# Patient Record
Sex: Female | Born: 1990 | Race: Asian | Hispanic: No | Marital: Single | Smoking: Never smoker
Health system: Southern US, Community
[De-identification: ages and names within clinical notes are randomized; demographics above are authoritative.]

## PROBLEM LIST (undated history)

## (undated) ENCOUNTER — Inpatient Hospital Stay (HOSPITAL_COMMUNITY): Payer: Self-pay

## (undated) DIAGNOSIS — D649 Anemia, unspecified: Secondary | ICD-10-CM

## (undated) DIAGNOSIS — Z789 Other specified health status: Secondary | ICD-10-CM

## (undated) HISTORY — PX: NO PAST SURGERIES: SHX2092

---

## 2012-07-11 ENCOUNTER — Inpatient Hospital Stay (HOSPITAL_COMMUNITY)
Admission: AD | Admit: 2012-07-11 | Discharge: 2012-07-11 | Disposition: A | Discharge status: Home / Self Care | Payer: BC Managed Care – PPO | Source: Ambulatory Visit | Attending: Obstetrics & Gynecology | Admitting: Obstetrics & Gynecology

## 2012-07-11 ENCOUNTER — Inpatient Hospital Stay (HOSPITAL_COMMUNITY): Payer: BC Managed Care – PPO

## 2012-07-11 ENCOUNTER — Encounter (HOSPITAL_COMMUNITY): Payer: Self-pay | Admitting: *Deleted

## 2012-07-11 DIAGNOSIS — M545 Low back pain, unspecified: Secondary | ICD-10-CM | POA: Insufficient documentation

## 2012-07-11 DIAGNOSIS — Z3490 Encounter for supervision of normal pregnancy, unspecified, unspecified trimester: Secondary | ICD-10-CM

## 2012-07-11 DIAGNOSIS — R109 Unspecified abdominal pain: Secondary | ICD-10-CM | POA: Insufficient documentation

## 2012-07-11 DIAGNOSIS — O26899 Other specified pregnancy related conditions, unspecified trimester: Secondary | ICD-10-CM

## 2012-07-11 DIAGNOSIS — O99891 Other specified diseases and conditions complicating pregnancy: Secondary | ICD-10-CM | POA: Insufficient documentation

## 2012-07-11 HISTORY — DX: Other specified health status: Z78.9

## 2012-07-11 LAB — CBC WITH DIFFERENTIAL/PLATELET
Basophils Absolute: 0 10*3/uL (ref 0.0–0.1)
Eosinophils Absolute: 0.1 10*3/uL (ref 0.0–0.7)
Lymphs Abs: 2.2 10*3/uL (ref 0.7–4.0)
MCHC: 33.3 g/dL (ref 30.0–36.0)
MCV: 69.4 fL — ABNORMAL LOW (ref 78.0–100.0)
Monocytes Absolute: 1 10*3/uL (ref 0.1–1.0)
Monocytes Relative: 10 % (ref 3–12)
Platelets: 348 10*3/uL (ref 150–400)
RDW: 14.9 % (ref 11.5–15.5)
WBC: 9.5 10*3/uL (ref 4.0–10.5)

## 2012-07-11 LAB — URINALYSIS, ROUTINE W REFLEX MICROSCOPIC
Glucose, UA: NEGATIVE mg/dL
Hgb urine dipstick: NEGATIVE
Ketones, ur: NEGATIVE mg/dL
Protein, ur: NEGATIVE mg/dL

## 2012-07-11 LAB — WET PREP, GENITAL
Trich, Wet Prep: NONE SEEN
Yeast Wet Prep HPF POC: NONE SEEN

## 2012-07-11 LAB — POCT PREGNANCY, URINE: Preg Test, Ur: POSITIVE — AB

## 2012-07-11 NOTE — MAU Provider Note (Signed)
History     CSN: [id number]  Arrival date and time: 07/11/12 1109   First Provider Initiated Contact with Patient 07/11/12 1146      Chief Complaint  Patient presents with  . Abdominal Pain  . Back Pain   HPI Samantha Sellers is a 21 y.o. female @ [redacted]w[redacted]d gestation who presents to MAU with abdominal pain. The pain started 3 days ago.  She describes the pain as cramping.  The pain is located in the lower abdomen and radiates to the lower back. Denies vaginal bleeding or discharge. Last pap smear 4 years ago and was normal at [facility]. Current sex partner x 2 years. No birth control. Has not started prenatal care but plans care with [facility]. The history was provided by the patient.  OB History    Grav Para Term Preterm Abortions TAB SAB Ect Mult Living   1               Past Medical History  Diagnosis Date  . No pertinent past medical history     Past Surgical History  Procedure Date  . No past surgeries     History reviewed. No pertinent family history.  History  Substance Use Topics  . Smoking status: Never Smoker   . Smokeless tobacco: Not on file  . Alcohol Use: No    Allergies: No Known Allergies  No prescriptions prior to admission    Review of Systems  Constitutional: Negative for fever, chills and weight loss.  HENT: Negative for ear pain, nosebleeds, congestion, sore throat and neck pain.   Eyes: Negative for blurred vision, double vision, photophobia and pain.  Respiratory: Negative for cough, shortness of breath and wheezing.   Cardiovascular: Negative for chest pain, palpitations and leg swelling.  Gastrointestinal: Positive for abdominal pain. Negative for heartburn, nausea, vomiting, diarrhea and constipation.  Genitourinary: Negative for dysuria, urgency and frequency.  Musculoskeletal: Positive for back pain. Negative for myalgias.  Skin: Negative for itching and rash.  Neurological: Negative for dizziness, sensory change, speech  change, seizures, weakness and headaches.  Endo/Heme/Allergies: Does not bruise/bleed easily.  Psychiatric/Behavioral: Negative for depression and substance abuse. The patient is not nervous/anxious and does not have insomnia.    Physical Exam   Blood pressure 120/72, pulse 86, temperature 98.1 F (36.7 C), temperature source Oral, resp. rate 16, height 5' (1.524 m), weight 134 lb (60.782 kg), last menstrual period 05/15/2012.  Physical Exam  Nursing note and vitals reviewed. Constitutional: She is oriented to person, place, and time. She appears well-developed and well-nourished. No distress.  HENT:  Head: Normocephalic and atraumatic.  Eyes: EOM are normal.  Neck: Neck supple.  Cardiovascular: Normal rate.   Respiratory: Effort normal.  GI: Soft. There is no tenderness.       Unable to reproduce the crampy pain that the patient has had.  Genitourinary:       External genitalia without lesions. White discharge vaginal vault. Cervix long, closed, no CMT, no adnexal tenderness. Uterus slightly enlarged.  Musculoskeletal: Normal range of motion.  Neurological: She is alert and oriented to person, place, and time.  Skin: Skin is warm and dry.  Psychiatric: She has a normal mood and affect. Her behavior is normal. Judgment and thought content normal.   Results for orders placed during the hospital encounter of 07/11/12 (from the past 24 hour(s))  URINALYSIS, ROUTINE W REFLEX MICROSCOPIC     Status: Normal   Collection Time   07/11/12 11:27 AM  Component Value Range   Color, Urine YELLOW  YELLOW   APPearance CLEAR  CLEAR   Specific Gravity, Urine 1.025  1.005 - 1.030   pH 6.0  5.0 - 8.0   Glucose, UA NEGATIVE  NEGATIVE mg/dL   Hgb urine dipstick NEGATIVE  NEGATIVE   Bilirubin Urine NEGATIVE  NEGATIVE   Ketones, ur NEGATIVE  NEGATIVE mg/dL   Protein, ur NEGATIVE  NEGATIVE mg/dL   Urobilinogen, UA 0.2  0.0 - 1.0 mg/dL   Nitrite NEGATIVE  NEGATIVE   Leukocytes, UA NEGATIVE   NEGATIVE  POCT PREGNANCY, URINE     Status: Abnormal   Collection Time   07/11/12 11:32 AM      Component Value Range   Preg Test, Ur POSITIVE (*) NEGATIVE  WET PREP, GENITAL     Status: Abnormal   Collection Time   07/11/12 12:10 PM      Component Value Range   Yeast Wet Prep HPF POC NONE SEEN  NONE SEEN   Trich, Wet Prep NONE SEEN  NONE SEEN   Clue Cells Wet Prep HPF POC NONE SEEN  NONE SEEN   WBC, Wet Prep HPF POC MODERATE (*) NONE SEEN  CBC WITH DIFFERENTIAL     Status: Abnormal   Collection Time   07/11/12 12:25 PM      Component Value Range   WBC 9.5  4.0 - 10.5 K/uL   RBC 4.93  3.87 - 5.11 MIL/uL   Hemoglobin 11.4 (*) 12.0 - 15.0 g/dL   HCT 16.1 (*) 09.6 - 04.5 %   MCV 69.4 (*) 78.0 - 100.0 fL   MCH 23.1 (*) 26.0 - 34.0 pg   MCHC 33.3  30.0 - 36.0 g/dL   RDW 40.9  81.1 - 91.4 %   Platelets 348  150 - 400 K/uL   Neutrophils Relative 66  43 - 77 %   Lymphocytes Relative 23  12 - 46 %   Monocytes Relative 10  3 - 12 %   Eosinophils Relative 1  0 - 5 %   Basophils Relative 0  0 - 1 %   Neutro Abs 6.2  1.7 - 7.7 K/uL   Lymphs Abs 2.2  0.7 - 4.0 K/uL   Monocytes Absolute 1.0  0.1 - 1.0 K/uL   Eosinophils Absolute 0.1  0.0 - 0.7 K/uL   Basophils Absolute 0.0  0.0 - 0.1 K/uL   Smear Review MORPHOLOGY UNREMARKABLE    HCG, QUANTITATIVE, PREGNANCY     Status: Abnormal   Collection Time   07/11/12 12:25 PM      Component Value Range   hCG, Beta Chain, [place] Longest [address] (*) <5 mIU/mL  ABO/RH     Status: Normal (Preliminary result)   Collection Time   07/11/12 12:25 PM      Component Value Range   ABO/RH(D) O POS     Procedures    Assessment: 21 y.o. female with abdominal cramping   Viable IUP @ 6.[redacted] weeks gestation    Plan:  Tylenol prn   Start prenatal care as planned, return as needed.  I have reviewed this patient's vital signs, nurses notes, appropriate labs and imaging. I have discussed the results with the patient and plan of care. Patient voices  understanding.  NEESE,HOPE, RN, FNP, [facility] 07/11/2012, 1:29 PM

## 2012-07-11 NOTE — MAU Note (Signed)
Pt states when lying down notes lower abdominal pain as well as low back pain. Denies abnormal vaginal d/c changes or bleeding. [id number], had +upt at home Monday. Denies uti s/s.

## 2012-07-11 NOTE — MAU Note (Signed)
Only has abd pain when lying down. Notes back pain since Sunday which is intermittent.

## 2012-07-25 NOTE — L&D Delivery Note (Signed)
Patient was C/C/+1 and pushed for approx 65 minutes with  No epidural.   NSVD  Female  infant, Apgars 9/9, weight pending.   The patient had a second degree laceration repaired with 2-0 vicryl.  Left labial skin evulsion reapproximated. Fundus was firm. EBL was expected. Placenta was delivered intact. Vagina was clear.  Baby was vigorous to bedside.  Samantha Sellers

## 2012-10-26 LAB — OB RESULTS CONSOLE GC/CHLAMYDIA: Gonorrhea: NEGATIVE

## 2012-10-26 LAB — OB RESULTS CONSOLE HIV ANTIBODY (ROUTINE TESTING): HIV: NONREACTIVE

## 2013-02-06 LAB — OB RESULTS CONSOLE GBS: GBS: NEGATIVE

## 2013-03-04 ENCOUNTER — Encounter (HOSPITAL_COMMUNITY): Payer: Self-pay

## 2013-03-04 ENCOUNTER — Inpatient Hospital Stay (HOSPITAL_COMMUNITY)
Admission: AD | Admit: 2013-03-04 | Discharge: 2013-03-07 | DRG: 373 | Disposition: A | Discharge status: Home / Self Care | Payer: BC Managed Care – PPO | Source: Ambulatory Visit | Attending: Obstetrics & Gynecology | Admitting: Obstetrics & Gynecology

## 2013-03-04 HISTORY — DX: Anemia, unspecified: D64.9

## 2013-03-04 LAB — TYPE AND SCREEN
ABO/RH(D): O POS
Antibody Screen: NEGATIVE

## 2013-03-04 LAB — CBC
HCT: 31.8 % — ABNORMAL LOW (ref 36.0–46.0)
Hemoglobin: 10.4 g/dL — ABNORMAL LOW (ref 12.0–15.0)
MCH: 20.8 pg — ABNORMAL LOW (ref 26.0–34.0)
MCHC: 32.7 g/dL (ref 30.0–36.0)
MCV: 63.5 fL — ABNORMAL LOW (ref 78.0–100.0)
Platelets: 345 10*3/uL (ref 150–400)
RBC: 5.01 MIL/uL (ref 3.87–5.11)
RDW: 18.2 % — ABNORMAL HIGH (ref 11.5–15.5)
WBC: 12.5 10*3/uL — ABNORMAL HIGH (ref 4.0–10.5)

## 2013-03-04 MED ORDER — ACETAMINOPHEN 325 MG PO TABS: 650.0000 mg | ORAL_TABLET | ORAL | Status: DC | PRN

## 2013-03-04 MED ORDER — ONDANSETRON HCL 4 MG/2ML IJ SOLN: 4.0000 mg | Freq: Four times a day (QID) | INTRAMUSCULAR | Status: DC | PRN

## 2013-03-04 MED ORDER — FLEET ENEMA 7-19 GM/118ML RE ENEM: 1.0000 | ENEMA | RECTAL | Status: DC | PRN

## 2013-03-04 MED ORDER — LACTATED RINGERS IV SOLN: 500.0000 mL | INTRAVENOUS | Status: DC | PRN

## 2013-03-04 MED ORDER — OXYTOCIN 40 UNITS IN LACTATED RINGERS INFUSION - SIMPLE MED
1.0000 m[IU]/min | INTRAVENOUS | Status: DC
  Administered 2013-03-04: 2 m[IU]/min via INTRAVENOUS
  Filled 2013-03-04: qty 1000

## 2013-03-04 MED ORDER — LACTATED RINGERS IV SOLN
INTRAVENOUS | Status: DC
  Administered 2013-03-04 – 2013-03-05 (×3): via INTRAVENOUS

## 2013-03-04 MED ORDER — BUTORPHANOL TARTRATE 1 MG/ML IJ SOLN
1.0000 mg | INTRAMUSCULAR | Status: DC | PRN
  Administered 2013-03-05: 1 mg via INTRAVENOUS
  Filled 2013-03-04: qty 1

## 2013-03-04 MED ORDER — OXYTOCIN BOLUS FROM INFUSION: 500.0000 mL | INTRAVENOUS | Status: DC

## 2013-03-04 MED ORDER — OXYTOCIN 40 UNITS IN LACTATED RINGERS INFUSION - SIMPLE MED
62.5000 mL/h | INTRAVENOUS | Status: DC
  Administered 2013-03-05: 62.5 mL/h via INTRAVENOUS

## 2013-03-04 MED ORDER — LIDOCAINE HCL (PF) 1 % IJ SOLN
30.0000 mL | INTRAMUSCULAR | Status: AC | PRN
Start: 2013-03-04 — End: 2013-03-07
  Administered 2013-03-05: 30 mL via SUBCUTANEOUS
  Filled 2013-03-04 (×2): qty 30

## 2013-03-04 MED ORDER — CITRIC ACID-SODIUM CITRATE 334-500 MG/5ML PO SOLN: 30.0000 mL | ORAL | Status: DC | PRN

## 2013-03-04 MED ORDER — OXYCODONE-ACETAMINOPHEN 5-325 MG PO TABS: 1.0000 | ORAL_TABLET | ORAL | Status: DC | PRN

## 2013-03-04 MED ORDER — TERBUTALINE SULFATE 1 MG/ML IJ SOLN: 0.2500 mg | Freq: Once | INTRAMUSCULAR | Status: AC | PRN

## 2013-03-04 MED ORDER — IBUPROFEN 600 MG PO TABS
600.0000 mg | ORAL_TABLET | Freq: Four times a day (QID) | ORAL | Status: DC | PRN
  Filled 2013-03-04 (×8): qty 1

## 2013-03-04 NOTE — H&P (Addendum)
22 y.o. G1P0  Estimated Date of Delivery: 03/07/13 admitted at 39/[redacted] weeks gestation in early labor and SROM.  Prenatal Transfer Tool  Maternal Diabetes: No Genetic Screening: Declined Maternal Ultrasounds/Referrals: Normal Fetal Ultrasounds or other Referrals:  None Maternal Substance Abuse:  No Significant Maternal Medications:  None Significant Maternal Lab Results: None Other Significant Pregnancy Complications:  None  Afebrile, VSS Heart and Lungs: No active disease Abdomen: soft, gravid, EFW AGA. Cervical exam:  1/80.  Pooling, fern +  Impression: SROM, early labor  Plan:  Admit for delivery.  Pitocin if necessary

## 2013-03-04 NOTE — MAU Note (Signed)
Pt stated she passed her mucus plug this morning and then has had a constant wetness all day. Having occasional mild ctx and good fetal movement.

## 2013-03-05 ENCOUNTER — Encounter (HOSPITAL_COMMUNITY): Payer: Self-pay | Admitting: *Deleted

## 2013-03-05 LAB — RPR: RPR Ser Ql: NONREACTIVE

## 2013-03-05 MED ORDER — BENZOCAINE-MENTHOL 20-0.5 % EX AERO: 1.0000 "application " | INHALATION_SPRAY | CUTANEOUS | Status: DC | PRN

## 2013-03-05 MED ORDER — SIMETHICONE 80 MG PO CHEW: 80.0000 mg | CHEWABLE_TABLET | ORAL | Status: DC | PRN

## 2013-03-05 MED ORDER — TETANUS-DIPHTH-ACELL PERTUSSIS 5-2.5-18.5 LF-MCG/0.5 IM SUSP
0.5000 mL | Freq: Once | INTRAMUSCULAR | Status: AC
  Administered 2013-03-06: 0.5 mL via INTRAMUSCULAR

## 2013-03-05 MED ORDER — DIPHENHYDRAMINE HCL 50 MG/ML IJ SOLN: 12.5000 mg | INTRAMUSCULAR | Status: DC | PRN

## 2013-03-05 MED ORDER — SENNOSIDES-DOCUSATE SODIUM 8.6-50 MG PO TABS
2.0000 | ORAL_TABLET | Freq: Every day | ORAL | Status: DC
  Administered 2013-03-05 – 2013-03-06 (×2): 2 via ORAL

## 2013-03-05 MED ORDER — DIPHENHYDRAMINE HCL 25 MG PO CAPS: 25.0000 mg | ORAL_CAPSULE | Freq: Four times a day (QID) | ORAL | Status: DC | PRN

## 2013-03-05 MED ORDER — EPHEDRINE 5 MG/ML INJ
10.0000 mg | INTRAVENOUS | Status: DC | PRN
  Filled 2013-03-05: qty 2

## 2013-03-05 MED ORDER — DIBUCAINE 1 % RE OINT: 1.0000 "application " | TOPICAL_OINTMENT | RECTAL | Status: DC | PRN

## 2013-03-05 MED ORDER — ONDANSETRON HCL 4 MG/2ML IJ SOLN: 4.0000 mg | INTRAMUSCULAR | Status: DC | PRN

## 2013-03-05 MED ORDER — OXYCODONE-ACETAMINOPHEN 5-325 MG PO TABS: 1.0000 | ORAL_TABLET | ORAL | Status: DC | PRN

## 2013-03-05 MED ORDER — ONDANSETRON HCL 4 MG PO TABS: 4.0000 mg | ORAL_TABLET | ORAL | Status: DC | PRN

## 2013-03-05 MED ORDER — IBUPROFEN 600 MG PO TABS
600.0000 mg | ORAL_TABLET | Freq: Four times a day (QID) | ORAL | Status: DC
  Administered 2013-03-05 – 2013-03-07 (×8): 600 mg via ORAL

## 2013-03-05 MED ORDER — WITCH HAZEL-GLYCERIN EX PADS: 1.0000 "application " | MEDICATED_PAD | CUTANEOUS | Status: DC | PRN

## 2013-03-05 MED ORDER — ZOLPIDEM TARTRATE 5 MG PO TABS: 5.0000 mg | ORAL_TABLET | Freq: Every evening | ORAL | Status: DC | PRN

## 2013-03-05 MED ORDER — FENTANYL 2.5 MCG/ML BUPIVACAINE 1/10 % EPIDURAL INFUSION (WH - ANES): 14.0000 mL/h | INTRAMUSCULAR | Status: DC | PRN

## 2013-03-05 MED ORDER — LANOLIN HYDROUS EX OINT: TOPICAL_OINTMENT | CUTANEOUS | Status: DC | PRN

## 2013-03-05 MED ORDER — PRENATAL MULTIVITAMIN CH
1.0000 | ORAL_TABLET | Freq: Every day | ORAL | Status: DC
  Administered 2013-03-06 – 2013-03-07 (×2): 1 via ORAL
  Filled 2013-03-05 (×2): qty 1

## 2013-03-05 MED ORDER — PHENYLEPHRINE 40 MCG/ML (10ML) SYRINGE FOR IV PUSH (FOR BLOOD PRESSURE SUPPORT)
80.0000 ug | PREFILLED_SYRINGE | INTRAVENOUS | Status: DC | PRN
  Filled 2013-03-05: qty 2

## 2013-03-05 MED ORDER — LACTATED RINGERS IV SOLN: 500.0000 mL | Freq: Once | INTRAVENOUS | Status: DC

## 2013-03-05 NOTE — Progress Notes (Signed)
Pt comfortable between contractions.  No epidural. FHT: 130, reactive, still with occ. Var decels, but good accels and + scalp stim TOCO: 1-5 SVE: 6/80/-1 IUPC placed Will restart pitocin

## 2013-03-05 NOTE — Progress Notes (Signed)
Called about tracing.  Pitocin has been DC'd.  Variables with contractions.  Most mild, rarel moderate to severe.  Excellent variability.  No evidence of acidosis or impending acidosis at this time.  Will continue trial of labor.

## 2013-03-05 NOTE — Progress Notes (Signed)
Updated on pt's progress, SVE, varibles, received orders to restart pitocin.

## 2013-03-06 LAB — CBC
Hemoglobin: 8.1 g/dL — ABNORMAL LOW (ref 12.0–15.0)
MCV: 63.1 fL — ABNORMAL LOW (ref 78.0–100.0)
Platelets: 247 10*3/uL (ref 150–400)
RBC: 3.85 MIL/uL — ABNORMAL LOW (ref 3.87–5.11)
WBC: 17.6 10*3/uL — ABNORMAL HIGH (ref 4.0–10.5)

## 2013-03-06 NOTE — Progress Notes (Signed)
Post Partum Day 1 Subjective: no complaints  Objective: Blood pressure 109/70, pulse 76, temperature 97.9 F (36.6 C), temperature source Oral, resp. rate 18, height 5' (1.524 m), weight 66.225 kg (146 lb), last menstrual period 05/15/2012, SpO2 99.00%,  breastfeeding.  Physical Exam:  General: alert Lochia: appropriate Uterine Fundus: firm   Recent Labs  03/04/13 2025 03/06/13 0605  HGB 10.4* 8.1*  HCT 31.8* 24.3*    Assessment/Plan: Plan for discharge tomorrow   LOS: 2 days   Antino Mayabb D 03/06/2013, 9:50 AM

## 2013-03-06 NOTE — Lactation Note (Signed)
This note was copied from the chart of Samantha Ashla Zech. Lactation Consultation Note: initial visit with mom who has photographer and other visitors present. Mom reports that baby just fed and is doing well. No questions at present. BF brochure left with mom. To call for assist prn  Patient Name: Samantha Sellers [id number] Date: 03/06/2013 Reason for consult: Initial assessment   Maternal Data Formula Feeding for Exclusion: No Infant to breast within first hour of birth: Yes Does the patient have breastfeeding experience prior to this delivery?: No  Feeding Feeding Type: Breast Milk Length of feed: 10 min  LATCH Score/Interventions                      Lactation Tools Discussed/Used     Consult Status Consult Status: Follow-up Date: 03/07/13 Follow-up type: In-patient    Pamelia Hoit 03/06/2013, 2:32 PM

## 2013-03-07 NOTE — Lactation Note (Signed)
This note was copied from the chart of Samantha Sellers. Lactation Consultation Note  Patient Name: Samantha Sellers [id number] Date: 03/07/2013 Reason for consult: Follow-up assessment   Maternal Data    Feeding   LATCH Score/Interventions Latch: Repeated attempts needed to sustain latch, nipple held in mouth throughout feeding, stimulation needed to elicit sucking reflex.  Audible Swallowing: None  Type of Nipple: Everted at rest and after stimulation  Comfort (Breast/Nipple): Soft / non-tender     Hold (Positioning): Assistance needed to correctly position infant at breast and maintain latch.  LATCH Score: 6  Lactation Tools Discussed/Used     Consult Status Consult Status: Complete  Baby had had formula about 30 minutes ago and was not really interested in nursing. Took a few sucks then off to sleep. Assisted mom with pillows and positioning. Mom reports that baby is now nursing better on both sides- had been just doing one breast. Was giving formula because she was not nursing much and she want to make sure she was getting enough.  Reviewed wide open mouth and keeping the baby close to the breast throughout feeding .Discussed engorgement prevention and treatment. No questions at present. To call prn.  Pamelia Hoit 03/07/2013, 9:25 AM

## 2013-03-07 NOTE — Progress Notes (Signed)
Patient is eating, ambulating, voiding.  Pain control is good.  Filed Vitals:   03/05/13 2100 03/06/13 0500 03/06/13 1803 03/07/13 0540  BP: 108/59 109/70 114/75 90/61  Pulse: 93 76 99 96  Temp: 98 F (36.7 C) 97.9 F (36.6 C) 97.8 F (36.6 C) 98.3 F (36.8 C)  TempSrc: Oral Oral Oral Oral  Resp: 20 18 18 16   Height:      Weight:      SpO2: 99% 99%      Fundus firm Perineum without swelling.  Lab Results  Component Value Date   WBC 17.6* 03/06/2013   HGB 8.1* 03/06/2013   HCT 24.3* 03/06/2013   MCV 63.1* 03/06/2013   PLT 247 03/06/2013    --/--/O POS (08/11 2029)/RI  A/P Post partum day 2.  Routine care.  Expect d/c today.    Aizza Santiago A

## 2013-03-07 NOTE — Discharge Summary (Signed)
Obstetric Discharge Summary Reason for Admission: onset of labor Prenatal Procedures: none Intrapartum Procedures: spontaneous vaginal delivery Postpartum Procedures: none Complications-Operative and Postpartum: 2 degree perineal laceration Hemoglobin  Date Value Range Status  03/06/2013 8.1* 12.0 - 15.0 g/dL Final     DELTA CHECK NOTED     REPEATED TO VERIFY     HCT  Date Value Range Status  03/06/2013 24.3* 36.0 - 46.0 % Final     Discharge Diagnoses: Term Pregnancy-delivered  Discharge Information: Date: 03/07/2013 Activity: pelvic rest Diet: routine Medications: Ibuprofen and Iron Condition: stable Instructions: refer to practice specific booklet Discharge to: home Follow-up Information   Follow up with Mickel Baas, MD In 4 weeks.   Specialty:  Obstetrics and Gynecology   Contact information:   [address] [place] [address] [phone]       Newborn Data: Live born female  Birth Weight: 7 lb 3.5 oz (3274 g) APGAR: 9, 9  Home with mother.  Samantha Sellers 03/07/2013, 7:03 AM

## 2014-05-26 ENCOUNTER — Encounter (HOSPITAL_COMMUNITY): Payer: Self-pay | Admitting: *Deleted

## 2015-06-24 ENCOUNTER — Inpatient Hospital Stay (HOSPITAL_COMMUNITY)
Admission: AD | Admit: 2015-06-24 | Discharge: 2015-06-24 | Disposition: A | Discharge status: Home / Self Care | Payer: BLUE CROSS/BLUE SHIELD | Source: Ambulatory Visit | Attending: Obstetrics & Gynecology | Admitting: Obstetrics & Gynecology

## 2015-06-24 ENCOUNTER — Inpatient Hospital Stay (HOSPITAL_COMMUNITY): Payer: BLUE CROSS/BLUE SHIELD

## 2015-06-24 ENCOUNTER — Encounter (HOSPITAL_COMMUNITY): Payer: Self-pay | Admitting: Student

## 2015-06-24 DIAGNOSIS — O209 Hemorrhage in early pregnancy, unspecified: Secondary | ICD-10-CM | POA: Diagnosis present

## 2015-06-24 DIAGNOSIS — Z3491 Encounter for supervision of normal pregnancy, unspecified, first trimester: Secondary | ICD-10-CM

## 2015-06-24 DIAGNOSIS — Z3A10 10 weeks gestation of pregnancy: Secondary | ICD-10-CM | POA: Insufficient documentation

## 2015-06-24 DIAGNOSIS — O4691 Antepartum hemorrhage, unspecified, first trimester: Secondary | ICD-10-CM | POA: Diagnosis not present

## 2015-06-24 LAB — CBC
HCT: 33.1 % — ABNORMAL LOW (ref 36.0–46.0)
HEMOGLOBIN: 11.1 g/dL — AB (ref 12.0–15.0)
MCH: 22.9 pg — AB (ref 26.0–34.0)
MCHC: 33.5 g/dL (ref 30.0–36.0)
MCV: 68.2 fL — ABNORMAL LOW (ref 78.0–100.0)
PLATELETS: 405 10*3/uL — AB (ref 150–400)
RBC: 4.85 MIL/uL (ref 3.87–5.11)
RDW: 14.3 % (ref 11.5–15.5)
WBC: 10.3 10*3/uL (ref 4.0–10.5)

## 2015-06-24 LAB — HCG, QUANTITATIVE, PREGNANCY: HCG, BETA CHAIN, QUANT, S: 184912 m[IU]/mL — AB (ref <5)

## 2015-06-24 LAB — URINALYSIS, ROUTINE W REFLEX MICROSCOPIC
Bilirubin Urine: NEGATIVE
GLUCOSE, UA: NEGATIVE mg/dL
KETONES UR: 40 mg/dL — AB
LEUKOCYTES UA: NEGATIVE
Nitrite: NEGATIVE
PROTEIN: NEGATIVE mg/dL
Specific Gravity, Urine: 1.01 (ref 1.005–1.030)
pH: 6 (ref 5.0–8.0)

## 2015-06-24 LAB — WET PREP, GENITAL
CLUE CELLS WET PREP: NONE SEEN
Sperm: NONE SEEN
Trich, Wet Prep: NONE SEEN
Yeast Wet Prep HPF POC: NONE SEEN

## 2015-06-24 LAB — POCT PREGNANCY, URINE: PREG TEST UR: POSITIVE — AB

## 2015-06-24 LAB — OB RESULTS CONSOLE GC/CHLAMYDIA
CHLAMYDIA, DNA PROBE: NEGATIVE
GC PROBE AMP, GENITAL: NEGATIVE

## 2015-06-24 LAB — URINE MICROSCOPIC-ADD ON: BACTERIA UA: NONE SEEN

## 2015-06-24 LAB — ABO/RH: ABO/RH(D): O POS

## 2015-06-24 NOTE — MAU Note (Signed)
Happened last night while at work, felt a gush - went to bathroom and saw blood. None since. Had some pain at that time, none now.

## 2015-06-24 NOTE — MAU Provider Note (Signed)
History     CSN: [id number]  Arrival date and time: 06/24/15 1558   None        Chief Complaint  Patient presents with  . Possible Pregnancy  . Vaginal Bleeding  . Abdominal Pain   HPI Samantha Sellers is a 24 y.o. G2P1001 at [redacted]w[redacted]d who presents for vaginal bleeding.  Patient states while at work last night had an episode of abdominal pain & gush of bright red bleeding.  Denies abdominal pain or bleeding since then.  Last intercourse was 1 month ago.  Denies n/v/d, constipation, or vaginal discharge.  Has appointment to start prenatal care with [facility] ob[facility] next week.   OB History    Gravida Para Term Preterm AB TAB SAB Ectopic Multiple Living   Past Medical History  Diagnosis Date  . No pertinent past medical history   . Anemia     Past Surgical History  Procedure Laterality Date  . No past surgeries      History reviewed. No pertinent family history.  Social History  Substance Use Topics  . Smoking status: Never Smoker   . Smokeless tobacco: None  . Alcohol Use: No    Allergies: No Known Allergies  No prescriptions prior to admission    Review of Systems  Constitutional: Negative.   Gastrointestinal: Positive for abdominal pain. Negative for nausea, vomiting, diarrhea and constipation.  Genitourinary:       + vaginal bleeding x 1 episode   Physical Exam   Blood pressure 117/70, pulse 80, temperature 98.5 F (36.9 C), temperature source Oral, resp. rate 18, height 4' 11.5" (1.511 m), weight 139 lb 6.4 oz (63.231 kg), last menstrual period 04/15/2015, unknown if currently breastfeeding.  Physical Exam  Nursing note and vitals reviewed. Constitutional: She is oriented to person, place, and time. She appears well-developed and well-nourished. No distress.  HENT:  Head: Normocephalic and atraumatic.  Eyes: Conjunctivae are normal. Right eye exhibits no discharge. Left eye exhibits no discharge. No scleral icterus.  Neck:  Normal range of motion.  Cardiovascular: Normal rate, regular rhythm and normal heart sounds.   No murmur heard. Respiratory: Effort normal and breath sounds normal. No respiratory distress. She has no wheezes.  GI: Soft.  Genitourinary: Vagina normal. Uterus is enlarged. Uterus is not tender. Cervix exhibits no motion tenderness and no friability.  Small amount of old brown blood in canal.  No new blood.  Minimal amount of clear mucoid discharge at os Cervix closed  Neurological: She is alert and oriented to person, place, and time.  Skin: Skin is warm and dry. She is not diaphoretic.  Psychiatric: She has a normal mood and affect. Her behavior is normal. Judgment and thought content normal.    MAU Course  Procedures Results for orders placed or performed during the hospital encounter of 06/24/15 (from the past 24 hour(s))  Urinalysis, Routine w reflex microscopic (not at [facility])     Status: Abnormal   Collection Time: 06/24/15  4:30 PM  Result Value Ref Range   Color, Urine YELLOW YELLOW   APPearance CLEAR CLEAR   Specific Gravity, Urine 1.010 1.005 - 1.030   pH 6.0 5.0 - 8.0   Glucose, UA NEGATIVE NEGATIVE mg/dL   Hgb urine dipstick SMALL (A) NEGATIVE   Bilirubin Urine NEGATIVE NEGATIVE   Ketones, ur 40 (A) NEGATIVE mg/dL   Protein, ur NEGATIVE NEGATIVE mg/dL   Nitrite NEGATIVE  NEGATIVE   Leukocytes, UA NEGATIVE NEGATIVE  Urine microscopic-add on     Status: Abnormal   Collection Time: 06/24/15  4:30 PM  Result Value Ref Range   Squamous Epithelial / LPF 0-5 (A) NONE SEEN   WBC, UA 0-5 0 - 5 WBC/hpf   RBC / HPF 0-5 0 - 5 RBC/hpf   Bacteria, UA NONE SEEN NONE SEEN  Pregnancy, urine POC     Status: Abnormal   Collection Time: 06/24/15  4:39 PM  Result Value Ref Range   Preg Test, Ur POSITIVE (A) NEGATIVE  CBC     Status: Abnormal   Collection Time: 06/24/15  4:50 PM  Result Value Ref Range   WBC 10.3 4.0 - 10.5 K/uL   RBC 4.85 3.87 - 5.11 MIL/uL   Hemoglobin 11.1 (L)  12.0 - 15.0 g/dL   HCT 16.133.1 (L) 09.636.0 - 04.546.0 %   MCV 68.2 (L) 78.0 - 100.0 fL   MCH 22.9 (L) 26.0 - 34.0 pg   MCHC 33.5 30.0 - 36.0 g/dL   RDW 40.914.3 81.111.5 - 91.415.5 %   Platelets 405 (H) 150 - 400 K/uL  ABO/Rh     Status: None   Collection Time: 06/24/15  4:50 PM  Result Value Ref Range   ABO/RH(D) O POS   hCG, quantitative, pregnancy     Status: Abnormal   Collection Time: 06/24/15  4:50 PM  Result Value Ref Range   hCG, Beta Chain, Quant, S [id number] (H) <5 mIU/mL  Wet prep, genital     Status: Abnormal   Collection Time: 06/24/15  5:53 PM  Result Value Ref Range   Yeast Wet Prep HPF POC NONE SEEN NONE SEEN   Trich, Wet Prep NONE SEEN NONE SEEN   Clue Cells Wet Prep HPF POC NONE SEEN NONE SEEN   WBC, Wet Prep HPF POC MODERATE (A) NONE SEEN   Sperm NONE SEEN     [place] Ob Comp Less 14 Wks  06/24/2015  CLINICAL DATA:  Vaginal bleeding for 1 day. Gestational age by LMP of 10 weeks and 0 days. EXAM: OBSTETRIC <14 WK ULTRASOUND TECHNIQUE: Transabdominal ultrasound was performed for evaluation of the gestation as well as the maternal uterus and adnexal regions. COMPARISON:  None. FINDINGS: Intrauterine gestational sac: Visualized/normal in shape with appropriate surrounding decidual reaction. No subchorionic hemorrhage seen. Yolk sac:  Visualized. Embryo:  Visualized Cardiac Activity: Present Heart Rate: 176 bpm CRL:   27  mm   9 w 4 d                  US EDC: 01/23/2016 Maternal uterus/adnexae: Maternal left ovary appears normal and there is no mass or free fluid identified in the adjacent left adnexal region. Maternal right ovary is not seen but there is no mass or free fluid appreciated in the right adnexal region. No mass or free fluid within the cul-de-sac. IMPRESSION: 1. Single viable intrauterine pregnancy with estimated gestational age of [redacted] weeks and 4 days by crown-rump length measurement. Fetal heart rate measured at 176 beats per minute. No subchorionic hemorrhage or other pregnancy-related  complication seen. 2. No mass or free fluid identified within either adnexal region or cul-de-sac. Electronically Signed   By: Bary RichardStan  Maynard M.D.   On: 06/24/2015 17:33    MDM O positive Ultrasound shows IUP, no SCH Assessment and Plan  A: 1. Vaginal bleeding in pregnancy, first trimester   2. Normal IUP (intrauterine pregnancy) on prenatal ultrasound, first trimester  P: Discharge home Discussed reasons to return to MAU Pelvic rest until prenatal appointment GC/CT pending  Judeth Horn, NP  06/24/2015, 5:49 PM

## 2015-06-24 NOTE — Discharge Instructions (Signed)

## 2015-06-25 LAB — HIV ANTIBODY (ROUTINE TESTING W REFLEX): HIV SCREEN 4TH GENERATION: NONREACTIVE

## 2015-06-25 LAB — GC/CHLAMYDIA PROBE AMP (~~LOC~~) NOT AT ARMC
CHLAMYDIA, DNA PROBE: NEGATIVE
NEISSERIA GONORRHEA: NEGATIVE

## 2015-07-06 ENCOUNTER — Other Ambulatory Visit: Payer: Self-pay | Admitting: Obstetrics

## 2015-07-06 LAB — OB RESULTS CONSOLE HEPATITIS B SURFACE ANTIGEN: Hepatitis B Surface Ag: NEGATIVE

## 2015-07-06 LAB — OB RESULTS CONSOLE RUBELLA ANTIBODY, IGM: Rubella: IMMUNE

## 2015-07-06 LAB — OB RESULTS CONSOLE RPR: RPR: NONREACTIVE

## 2015-07-06 LAB — OB RESULTS CONSOLE HIV ANTIBODY (ROUTINE TESTING): HIV: NONREACTIVE

## 2015-07-07 LAB — CYTOLOGY - PAP

## 2015-07-26 NOTE — L&D Delivery Note (Signed)
Delivery Note  Patient was induced for occasional fetal decel, though no significant decels during labor. Augmentation with pitocin and cytotec. SROM shortly prior to delivery. Precipitous delivery; I arrived as nurse was delivering. I clamped and cut the cord and managed the third stage.  At 8:08 AM a viable female was delivered via Vaginal, Spontaneous Delivery (Presentation: Right Occiput Anterior).  APGAR: 10, 10; weight pending .   Placenta status: Intact, Spontaneous.  Cord: 3 vessels with the following complications: None.  Cord pH: not obtained  Anesthesia: None  Episiotomy: None Lacerations: 1st degree;Perineal Suture Repair: 3.0 vicryl Est. Blood Loss (mL): 50  Mom to postpartum.  Baby to Couplet care / Skin to Skin.  Cherrie Gauzeoah B Ziyan Hillmer 01/29/2016, 8:38 AM

## 2016-01-28 ENCOUNTER — Encounter (HOSPITAL_COMMUNITY): Payer: Self-pay | Admitting: *Deleted

## 2016-01-28 ENCOUNTER — Inpatient Hospital Stay (HOSPITAL_COMMUNITY): Payer: Medicaid Other

## 2016-01-28 ENCOUNTER — Inpatient Hospital Stay (HOSPITAL_COMMUNITY)
Admission: AD | Admit: 2016-01-28 | Discharge: 2016-01-30 | DRG: 775 | Disposition: A | Discharge status: Home / Self Care | Payer: Medicaid Other | Source: Ambulatory Visit | Attending: Obstetrics and Gynecology | Admitting: Obstetrics and Gynecology

## 2016-01-28 DIAGNOSIS — O480 Post-term pregnancy: Secondary | ICD-10-CM | POA: Diagnosis present

## 2016-01-28 DIAGNOSIS — Z3A41 41 weeks gestation of pregnancy: Secondary | ICD-10-CM | POA: Diagnosis not present

## 2016-01-28 DIAGNOSIS — R109 Unspecified abdominal pain: Secondary | ICD-10-CM

## 2016-01-28 DIAGNOSIS — O26899 Other specified pregnancy related conditions, unspecified trimester: Secondary | ICD-10-CM

## 2016-01-28 LAB — URINALYSIS, ROUTINE W REFLEX MICROSCOPIC
BILIRUBIN URINE: NEGATIVE
GLUCOSE, UA: NEGATIVE mg/dL
Hgb urine dipstick: NEGATIVE
KETONES UR: NEGATIVE mg/dL
Leukocytes, UA: NEGATIVE
Nitrite: NEGATIVE
PH: 6 (ref 5.0–8.0)
Protein, ur: NEGATIVE mg/dL
Specific Gravity, Urine: 1.01 (ref 1.005–1.030)

## 2016-01-28 LAB — CBC
HCT: 27.7 % — ABNORMAL LOW (ref 36.0–46.0)
HEMOGLOBIN: 8.8 g/dL — AB (ref 12.0–15.0)
MCH: 19.2 pg — ABNORMAL LOW (ref 26.0–34.0)
MCHC: 31.8 g/dL (ref 30.0–36.0)
MCV: 60.3 fL — ABNORMAL LOW (ref 78.0–100.0)
Platelets: 356 10*3/uL (ref 150–400)
RBC: 4.59 MIL/uL (ref 3.87–5.11)
RDW: 18.5 % — ABNORMAL HIGH (ref 11.5–15.5)
WBC: 8.2 10*3/uL (ref 4.0–10.5)

## 2016-01-28 LAB — TYPE AND SCREEN
ABO/RH(D): O POS
ANTIBODY SCREEN: NEGATIVE

## 2016-01-28 LAB — RAPID URINE DRUG SCREEN, HOSP PERFORMED
Amphetamines: NOT DETECTED
Barbiturates: NOT DETECTED
Benzodiazepines: NOT DETECTED
Cocaine: NOT DETECTED
Opiates: NOT DETECTED
TETRAHYDROCANNABINOL: NOT DETECTED

## 2016-01-28 LAB — GROUP B STREP BY PCR: Group B strep by PCR: NEGATIVE

## 2016-01-28 LAB — OB RESULTS CONSOLE GBS: STREP GROUP B AG: NEGATIVE

## 2016-01-28 MED ORDER — LACTATED RINGERS IV SOLN
500.0000 mL | INTRAVENOUS | Status: DC | PRN
Start: 2016-01-28 — End: 2016-01-29

## 2016-01-28 MED ORDER — OXYTOCIN 40 UNITS IN LACTATED RINGERS INFUSION - SIMPLE MED
2.5000 [IU]/h | INTRAVENOUS | Status: DC
  Filled 2016-01-28 (×2): qty 1000

## 2016-01-28 MED ORDER — TERBUTALINE SULFATE 1 MG/ML IJ SOLN
0.2500 mg | Freq: Once | INTRAMUSCULAR | Status: DC | PRN
  Filled 2016-01-28: qty 1

## 2016-01-28 MED ORDER — SOD CITRATE-CITRIC ACID 500-334 MG/5ML PO SOLN: 30.0000 mL | ORAL | Status: DC | PRN

## 2016-01-28 MED ORDER — LACTATED RINGERS IV SOLN
INTRAVENOUS | Status: DC
  Administered 2016-01-28: 200 mL via INTRAVENOUS

## 2016-01-28 MED ORDER — OXYTOCIN BOLUS FROM INFUSION
500.0000 mL | INTRAVENOUS | Status: DC
  Administered 2016-01-29: 500 mL via INTRAVENOUS

## 2016-01-28 MED ORDER — MISOPROSTOL 25 MCG QUARTER TABLET
25.0000 ug | ORAL_TABLET | ORAL | Status: DC | PRN
  Administered 2016-01-28: 25 ug via VAGINAL
  Filled 2016-01-28: qty 0.25
  Filled 2016-01-28: qty 1

## 2016-01-28 MED ORDER — ONDANSETRON HCL 4 MG/2ML IJ SOLN: 4.0000 mg | Freq: Four times a day (QID) | INTRAMUSCULAR | Status: DC | PRN

## 2016-01-28 MED ORDER — ACETAMINOPHEN 325 MG PO TABS: 650.0000 mg | ORAL_TABLET | ORAL | Status: DC | PRN

## 2016-01-28 MED ORDER — LIDOCAINE HCL (PF) 1 % IJ SOLN
30.0000 mL | INTRAMUSCULAR | Status: DC | PRN
  Filled 2016-01-28 (×2): qty 30

## 2016-01-28 NOTE — H&P (Signed)
LABOR AND DELIVERY ADMISSION HISTORY AND PHYSICAL NOTE  Samantha Sellers is a 25 y.o. female G2P1001 with IUP at 6858w1d by L/10 presenting for sharp pelvic pain, now resolved  She reports positive fetal movement. She denies leakage of fluid or vaginal bleeding.  Prenatal History/Complications:  Past Medical History: Past Medical History  Diagnosis Date  . No pertinent past medical history   . Anemia     Past Surgical History: Past Surgical History  Procedure Laterality Date  . No past surgeries      Obstetrical History: OB History    Gravida Para Term Preterm AB TAB SAB Ectopic Multiple Living   2 1 1       1       Social History: Social History   Social History  . Marital Status: Single    Spouse Name: N/A  . Number of Children: N/A  . Years of Education: N/A   Social History Main Topics  . Smoking status: Never Smoker   . Smokeless tobacco: Never Used  . Alcohol Use: No  . Drug Use: No  . Sexual Activity: Yes    Birth Control/ Protection: None   Other Topics Concern  . None   Social History Narrative    Family History: History reviewed. No pertinent family history.  Allergies: No Known Allergies  Prescriptions prior to admission  Medication Sig Dispense Refill Last Dose  . IRON PO Take 1 tablet by mouth daily.    01/28/2016 at Unknown time  . Prenatal Vit-Fe Fumarate-FA (PRENATAL MULTIVITAMIN) TABS tablet Take 1 tablet by mouth daily at 12 noon.   01/28/2016 at Unknown time     Review of Systems   All systems reviewed and negative except as stated in HPI  Blood pressure 113/74, pulse 91, temperature 99.1 F (37.3 C), resp. rate 18, height 4' 11.5" (1.511 m), weight 151 lb 6.4 oz (68.675 kg), last menstrual period 04/15/2015, unknown if currently breastfeeding. General appearance: alert, cooperative and appears stated age Lungs: clear to auscultation bilaterally Heart: regular rate and rhythm Abdomen: soft, non-tender; bowel sounds normal Extremities:  No calf swelling or tenderness Presentation: cephalic per rn exam Fetal monitoring: 140/mod/+a/one 3 min decel Uterine activity: irregular  Dilation: Fingertip Effacement (%): Thick Station:  (high) Exam by:: Dellie BurnsScarlett Murray, RN BSN   Prenatal labs: ABO, Rh: --/--/O POS (11/30 1650) Antibody:  neg Rubella: imm RPR:   neg HBsAg:   neg HIV: Non Reactive (11/30 1650)  GBS:   pcr neg 1 hr Glucola: 137, 3-hour wnl per patient Genetic screening:  declined Anatomy US: 2nl  Prenatal Transfer Tool  Maternal Diabetes: No Genetic Screening: Declined Maternal Ultrasounds/Referrals: Normal Fetal Ultrasounds or other Referrals:  None Maternal Substance Abuse:  No Significant Maternal Medications:  None Significant Maternal Lab Results: Lab values include: Group B Strep negative  Results for orders placed or performed during the hospital encounter of 01/28/16 (from the past 24 hour(s))  Urinalysis, Routine w reflex microscopic (not at [facility])   Collection Time: 01/28/16  6:10 PM  Result Value Ref Range   Color, Urine YELLOW YELLOW   APPearance CLEAR CLEAR   Specific Gravity, Urine 1.010 1.005 - 1.030   pH 6.0 5.0 - 8.0   Glucose, UA NEGATIVE NEGATIVE mg/dL   Hgb urine dipstick NEGATIVE NEGATIVE   Bilirubin Urine NEGATIVE NEGATIVE   Ketones, ur NEGATIVE NEGATIVE mg/dL   Protein, ur NEGATIVE NEGATIVE mg/dL   Nitrite NEGATIVE NEGATIVE   Leukocytes, UA NEGATIVE NEGATIVE  Group B strep  by PCR   Collection Time: 01/28/16  7:16 PM  Result Value Ref Range   Group B strep by PCR NEGATIVE NEGATIVE    There are no active problems to display for this patient.   Assessment: Samantha HornSivmany Artola is a 25 y.o. G2P1001 at 5844w1d here for sharp pelvic pain, appears to be round ligament pain. Not in labor, but post-term, with a decel. 8/8 bpp, but given iol is indicated, will keep to induce  #Labor: admit, likely cytotec/foley #Pain: Eventual epidural #FWB: Cat 1 #ID:  gbs pcr neg #MOF:  br/bot #MOC: undecided #Circ:  n/a  Samantha Sellers 01/28/2016, 9:16 PM

## 2016-01-28 NOTE — MAU Provider Note (Signed)
History     CSN: [id number]  Arrival date and time: 01/28/16 1747   First Provider Initiated Contact with Patient 01/28/16 1902      Chief Complaint  Patient presents with  . Pelvic Pain   HPIpt is [id number] who presents with pelvic pain.  Pt states that the pain occurs when she gets up out of bed or when she goes to the bathroom. Pt's prenatal care lapsed and has not had a prenatal visit since May.  Pt was [facility] in May due to not accepting Medicaid.  Pt was sent to [facility] but pt [facility] refused care due to not accepting Medicaid. Pt has had 2 vaginal deliveries without complication. Pt states she has not had any complications with this pregnancy. Pt had normal 3 hr GTT with abnormal 1 hr GTT There are 2 ultrasounds on record confirming dates. No GBS on record  RN note:  Expand All Collapse All   Pt reports having constant pelvic pain that started afew weeks ago has just gotten worse. Has not been to the doctors office since early May due to her insurance. [place] [place] [facility] [place] [facility] and then they stopped taking medicaid as well.        Past Medical History  Diagnosis Date  . No pertinent past medical history   . Anemia     Past Surgical History  Procedure Laterality Date  . No past surgeries      History reviewed. No pertinent family history.  Social History  Substance Use Topics  . Smoking status: Never Smoker   . Smokeless tobacco: Never Used  . Alcohol Use: No    Allergies: No Known Allergies  Prescriptions prior to admission  Medication Sig Dispense Refill Last Dose  . IRON PO Take 1 tablet by mouth daily.    01/28/2016 at Unknown time  . Prenatal Vit-Fe Fumarate-FA (PRENATAL MULTIVITAMIN) TABS tablet Take 1 tablet by mouth daily at 12 noon.   01/28/2016 at Unknown time    Review of Systems  Constitutional: Negative for fever and chills.  Gastrointestinal: Positive for abdominal pain. Negative for  nausea, vomiting, diarrhea and constipation.  Genitourinary: Negative for dysuria.  Neurological: Negative for headaches.   Physical Exam   Blood pressure 113/74, pulse 91, temperature 99.1 F (37.3 C), resp. rate 18, height 4' 11.5" (1.511 m), weight 151 lb 6.4 oz (68.675 kg), last menstrual period 04/15/2015, unknown if currently breastfeeding.  Physical Exam  Vitals reviewed. Constitutional: She is oriented to person, place, and time. She appears well-developed and well-nourished. No distress.  HENT:  Head: Normocephalic.  Eyes: Pupils are equal, round, and reactive to light.  Neck: Normal range of motion. Neck supple.  Respiratory: Effort normal.  GI: Soft. She exhibits no distension. There is no rebound and no guarding.  Genitourinary:  ceriv .5cm thick per RN  Musculoskeletal: Normal range of motion.  Neurological: She is alert and oriented to person, place, and time.  Skin: Skin is warm and dry.  Psychiatric: She has a normal mood and affect.    MAU Course  Procedures Results for orders placed or performed during the hospital encounter of 01/28/16 (from the past 48 hour(s))  Urinalysis, Routine w reflex microscopic (not [facility])     Status: None   Collection Time: 01/28/16  6:10 PM  Result Value Ref Range   Color, Urine YELLOW YELLOW   APPearance CLEAR CLEAR   Specific Gravity, Urine 1.010  1.005 - 1.030   pH 6.0 5.0 - 8.0   Glucose, UA NEGATIVE NEGATIVE mg/dL   Hgb urine dipstick NEGATIVE NEGATIVE   Bilirubin Urine NEGATIVE NEGATIVE   Ketones, ur NEGATIVE NEGATIVE mg/dL   Protein, ur NEGATIVE NEGATIVE mg/dL   Nitrite NEGATIVE NEGATIVE   Leukocytes, UA NEGATIVE NEGATIVE    Comment: MICROSCOPIC NOT DONE ON URINES WITH NEGATIVE PROTEIN, BLOOD, LEUKOCYTES, NITRITE, OR GLUCOSE <1000 mg/dL.  Group B strep by PCR     Status: None   Collection Time: 01/28/16  7:16 PM  Result Value Ref Range   Group B strep by PCR NEGATIVE NEGATIVE  US BPP 8/8 with AFI 7.7- Dr. Jolayne Pantheronstant  Notified Will admit for induction- Dr. Ashok PallWouk contacted nst reactive with one deceration to 45-120 bpm for 120 seconds- recovered to baseline after position change Assessment and Plan    LINEBERRY,SUSAN 01/28/2016, 7:16 PM

## 2016-01-28 NOTE — MAU Note (Signed)
Pt reports having constant pelvic pain that started afew weeks ago has just gotten worse. Has not been to the doctors office since early May due to her insurance. ([facility] [place] [facility] and [facility] taking medicaid as well.

## 2016-01-29 ENCOUNTER — Inpatient Hospital Stay (HOSPITAL_COMMUNITY): Payer: Medicaid Other | Admitting: Anesthesiology

## 2016-01-29 ENCOUNTER — Encounter (HOSPITAL_COMMUNITY): Payer: Self-pay | Admitting: Family Medicine

## 2016-01-29 DIAGNOSIS — Z3A41 41 weeks gestation of pregnancy: Secondary | ICD-10-CM

## 2016-01-29 DIAGNOSIS — O480 Post-term pregnancy: Secondary | ICD-10-CM

## 2016-01-29 LAB — RPR: RPR: NONREACTIVE

## 2016-01-29 MED ORDER — LACTATED RINGERS IV SOLN: 500.0000 mL | Freq: Once | INTRAVENOUS | Status: DC

## 2016-01-29 MED ORDER — SIMETHICONE 80 MG PO CHEW
80.0000 mg | CHEWABLE_TABLET | ORAL | Status: DC | PRN
Start: 2016-01-29 — End: 2016-01-30

## 2016-01-29 MED ORDER — DIPHENHYDRAMINE HCL 25 MG PO CAPS: 25.0000 mg | ORAL_CAPSULE | Freq: Four times a day (QID) | ORAL | Status: DC | PRN

## 2016-01-29 MED ORDER — PHENYLEPHRINE 40 MCG/ML (10ML) SYRINGE FOR IV PUSH (FOR BLOOD PRESSURE SUPPORT)
80.0000 ug | PREFILLED_SYRINGE | INTRAVENOUS | Status: DC | PRN
  Filled 2016-01-29: qty 5

## 2016-01-29 MED ORDER — SENNOSIDES-DOCUSATE SODIUM 8.6-50 MG PO TABS
2.0000 | ORAL_TABLET | ORAL | Status: DC
  Administered 2016-01-29: 2 via ORAL
  Filled 2016-01-29: qty 2

## 2016-01-29 MED ORDER — MEASLES, MUMPS & RUBELLA VAC ~~LOC~~ INJ
0.5000 mL | INJECTION | Freq: Once | SUBCUTANEOUS | Status: DC
  Filled 2016-01-29: qty 0.5

## 2016-01-29 MED ORDER — IBUPROFEN 600 MG PO TABS
600.0000 mg | ORAL_TABLET | Freq: Four times a day (QID) | ORAL | Status: DC
  Administered 2016-01-29 – 2016-01-30 (×5): 600 mg via ORAL
  Filled 2016-01-29 (×5): qty 1

## 2016-01-29 MED ORDER — PHENYLEPHRINE 40 MCG/ML (10ML) SYRINGE FOR IV PUSH (FOR BLOOD PRESSURE SUPPORT)
80.0000 ug | PREFILLED_SYRINGE | INTRAVENOUS | Status: DC | PRN
  Administered 2016-01-29: 80 ug via INTRAVENOUS
  Filled 2016-01-29: qty 5

## 2016-01-29 MED ORDER — COCONUT OIL OIL: 1.0000 "application " | TOPICAL_OIL | Status: DC | PRN

## 2016-01-29 MED ORDER — ONDANSETRON HCL 4 MG/2ML IJ SOLN: 4.0000 mg | INTRAMUSCULAR | Status: DC | PRN

## 2016-01-29 MED ORDER — EPHEDRINE 5 MG/ML INJ
10.0000 mg | INTRAVENOUS | Status: DC | PRN
  Filled 2016-01-29: qty 2

## 2016-01-29 MED ORDER — PHENYLEPHRINE 40 MCG/ML (10ML) SYRINGE FOR IV PUSH (FOR BLOOD PRESSURE SUPPORT)
PREFILLED_SYRINGE | INTRAVENOUS | Status: AC
  Filled 2016-01-29: qty 10

## 2016-01-29 MED ORDER — ACETAMINOPHEN 325 MG PO TABS: 650.0000 mg | ORAL_TABLET | ORAL | Status: DC | PRN

## 2016-01-29 MED ORDER — WITCH HAZEL-GLYCERIN EX PADS: 1.0000 "application " | MEDICATED_PAD | CUTANEOUS | Status: DC | PRN

## 2016-01-29 MED ORDER — FENTANYL 2.5 MCG/ML BUPIVACAINE 1/10 % EPIDURAL INFUSION (WH - ANES): 14.0000 mL/h | INTRAMUSCULAR | Status: DC | PRN

## 2016-01-29 MED ORDER — ZOLPIDEM TARTRATE 5 MG PO TABS: 5.0000 mg | ORAL_TABLET | Freq: Every evening | ORAL | Status: DC | PRN

## 2016-01-29 MED ORDER — FENTANYL 2.5 MCG/ML BUPIVACAINE 1/10 % EPIDURAL INFUSION (WH - ANES)
INTRAMUSCULAR | Status: AC
  Filled 2016-01-29: qty 125

## 2016-01-29 MED ORDER — TETANUS-DIPHTH-ACELL PERTUSSIS 5-2.5-18.5 LF-MCG/0.5 IM SUSP: 0.5000 mL | Freq: Once | INTRAMUSCULAR | Status: DC

## 2016-01-29 MED ORDER — ONDANSETRON HCL 4 MG PO TABS
4.0000 mg | ORAL_TABLET | ORAL | Status: DC | PRN
Start: 2016-01-29 — End: 2016-01-30

## 2016-01-29 MED ORDER — DIBUCAINE 1 % RE OINT: 1.0000 "application " | TOPICAL_OINTMENT | RECTAL | Status: DC | PRN

## 2016-01-29 MED ORDER — BENZOCAINE-MENTHOL 20-0.5 % EX AERO: 1.0000 "application " | INHALATION_SPRAY | CUTANEOUS | Status: DC | PRN

## 2016-01-29 MED ORDER — DIPHENHYDRAMINE HCL 50 MG/ML IJ SOLN: 12.5000 mg | INTRAMUSCULAR | Status: DC | PRN

## 2016-01-29 MED ORDER — PRENATAL MULTIVITAMIN CH
1.0000 | ORAL_TABLET | Freq: Every day | ORAL | Status: DC
  Administered 2016-01-29 – 2016-01-30 (×2): 1 via ORAL
  Filled 2016-01-29 (×2): qty 1

## 2016-01-29 NOTE — Anesthesia Pain Management Evaluation Note (Signed)
  CRNA Pain Management Visit Note  Patient: Samantha Sellers, 25 y.o., female  "Hello I am a member of the anesthesia team at [facility]. We have an anesthesia team available at all times to provide care throughout the hospital, including epidural management and anesthesia for C-section. I don't know your plan for the delivery whether it a natural birth, water birth, IV sedation, nitrous supplementation, doula or epidural, but we want to meet your pain goals."   1.Was your pain managed to your expectations on prior hospitalizations?   Yes   2.What is your expectation for pain management during this hospitalization?     Labor support without medications, Epidural and patient will determine plan as labor progresses  3.How can we help you reach that goal? Possibly Natural, possibly epidural--patient will determine plan as labor progresses.  Record the patient's initial score and the patient's pain goal.   Pain: 8  Pain Goal: 8 [facility] wants you to be able to say your pain was always managed very well.  Rashae Rother L 01/29/2016

## 2016-01-30 MED ORDER — IBUPROFEN 600 MG PO TABS: 600.0000 mg | ORAL_TABLET | Freq: Four times a day (QID) | ORAL | Status: DC | PRN

## 2016-01-30 NOTE — Discharge Summary (Signed)
OB Discharge Summary     Patient Name: Samantha Sellers DOB: 05/07/1991 MRN: [id number]  Date of admission: 01/28/2016 Delivering MD: Shonna Chock BEDFORD   Date of discharge: 01/30/2016  Admitting diagnosis: 41WKS PELVIC PAIN Intrauterine pregnancy: [redacted]w[redacted]d     Secondary diagnosis:  Active Problems:   Post term pregnancy, 41 weeks  Additional problems: postdates w/o prenatal care     Discharge diagnosis: Term Pregnancy Delivered                                                                                                Post partum procedures:none  Augmentation: AROM, Pitocin, Cytotec and Foley Balloon  Complications: None  Hospital course:  Induction of Labor With Vaginal Delivery   25 y.o. yo [id number] at [redacted]w[redacted]d was admitted to the hospital 01/28/2016 for induction of labor.  Indication for induction: Postdates.  Patient had an uncomplicated labor course as follows: Membrane Rupture Time/Date: 7:20 AM ,01/29/2016   Intrapartum Procedures: Episiotomy: None [1]                                         Lacerations:  1st degree [2];Perineal [11]  Patient had delivery of a Viable infant.  Information for the patient's newborn:  Samantha, Sellers Girl Samantha [[id number]]  Delivery Method: Vag-Spont Ceasar Mons from Delivery Summary)   01/29/2016  Details of delivery can be found in separate delivery note.  Patient had a routine postpartum course. Desires early d/c. Eating, drinking, voiding, ambulating well.  +flatus.  Lochia and pain wnl.  Denies dizziness, lightheadedness, or sob. No complaints. Patient is discharged home 01/30/2016.   Physical exam  Filed Vitals:   01/29/16 1115 01/29/16 1450 01/29/16 1749 01/30/16 0600  BP: 92/42  Pulse: 76 77 69 66  Temp: 98.2 F (36.8 C) 98.6 F (37 C) 98.7 F (37.1 C) 97.9 F (36.6 C)  TempSrc: Oral Oral Oral Oral  Resp: Height:      Weight:      SpO2:    100%   General: alert, cooperative and no distress Lochia:  appropriate Uterine Fundus: firm Incision: N/A DVT Evaluation: No evidence of DVT seen on physical exam. Negative Homan's sign. No cords or calf tenderness. No significant calf/ankle edema. Labs: Lab Results  Component Value Date   WBC 8.2 01/28/2016   HGB 8.8* 01/28/2016   HCT 27.7* 01/28/2016   MCV 60.3* 01/28/2016   PLT 356 01/28/2016   No flowsheet data found.  Discharge instruction: per After Visit Summary and "Baby and Me Booklet".  After visit meds:    Medication List    TAKE these medications        ibuprofen 600 MG tablet  Commonly known as:  ADVIL,MOTRIN  Take 1 tablet (600 mg total) by mouth every 6 (six) hours as needed for mild pain, moderate pain or cramping.     IRON PO  Take 1 tablet by mouth daily.     prenatal multivitamin Tabs tablet  Take 1 tablet by mouth daily at 12 noon.        Diet: routine diet  Activity: Advance as tolerated. Pelvic rest for 6 weeks.   Outpatient follow up:6 weeks Follow up Appt:No future appointments. Follow up Visit:No Follow-up on file.  Postpartum contraception: undecided, discussed. Abstinence until contraception  Newborn Data: Live born female  Birth Weight: 6 lb 14.9 oz (3145 g) APGAR: 10, 10  Baby Feeding: Bottle Disposition:home with mother  Mild anemia, asymptomatic, continue Fe  [facility] go [employer]t [facility], then had 1 visit w/ [facility] and let go d/t [facility]- then no prenatal care after that, so will f/u in our clinic for pp visit  01/30/2016 Samantha Sellers, Samantha Sellers, CNM

## 2016-01-30 NOTE — Discharge Instructions (Signed)
NO SEX UNTIL AFTER YOU GET YOUR BIRTH CONTROL  ° °Postpartum Care After Vaginal Delivery °After you deliver your newborn (postpartum period), the usual stay in the hospital is 24-72 hours. If there were problems with your labor or delivery, or if you have other medical problems, you might be in the hospital longer.  °While you are in the hospital, you will receive help and instructions on how to care for yourself and your newborn during the postpartum period.  °While you are in the hospital: °· Be sure to tell your nurses if you have pain or discomfort, as well as where you feel the pain and what makes the pain worse. °· If you had an incision made near your vagina (episiotomy) or if you had some tearing during delivery, the nurses may put ice packs on your episiotomy or tear. The ice packs may help to reduce the pain and swelling. °· If you are breastfeeding, you may feel uncomfortable contractions of your uterus for a couple of weeks. This is normal. The contractions help your uterus get back to normal size. °· It is normal to have some bleeding after delivery. °¨ For the first 1-3 days after delivery, the flow is red and the amount may be similar to a period. °¨ It is common for the flow to start and stop. °¨ In the first few days, you may pass some small clots. Let your nurses know if you begin to pass large clots or your flow increases. °¨ Do not  flush blood clots down the toilet before having the nurse look at them. °¨ During the next 3-10 days after delivery, your flow should become more watery and pink or brown-tinged in color. °¨ Ten to fourteen days after delivery, your flow should be a small amount of yellowish-white discharge. °¨ The amount of your flow will decrease over the first few weeks after delivery. Your flow may stop in 6-8 weeks. Most women have had their flow stop by 12 weeks after delivery. °· You should change your sanitary pads frequently. °· Wash your hands thoroughly with soap and water  for at least 20 seconds after changing pads, using the toilet, or before holding or feeding your newborn. °· You should feel like you need to empty your bladder within the first 6-8 hours after delivery. °· In case you become weak, lightheaded, or faint, call your nurse before you get out of bed for the first time and before you take a shower for the first time. °· Within the first few days after delivery, your breasts may begin to feel tender and full. This is called engorgement. Breast tenderness usually goes away within 48-72 hours after engorgement occurs. You may also notice milk leaking from your breasts. If you are not breastfeeding, do not stimulate your breasts. Breast stimulation can make your breasts produce more milk. °· Spending as much time as possible with your newborn is very important. During this time, you and your newborn can feel close and get to know each other. Having your newborn stay in your room (rooming in) will help to strengthen the bond with your newborn.  It will give you time to get to know your newborn and become comfortable caring for your newborn. °· Your hormones change after delivery. Sometimes the hormone changes can temporarily cause you to feel sad or tearful. These feelings should not last more than a few days. If these feelings last longer than that, you should talk to your caregiver. °· If desired,   talk to your caregiver about methods of family planning or contraception. °· Talk to your caregiver about immunizations. Your caregiver may want you to have the following immunizations before leaving the hospital: °¨ Tetanus, diphtheria, and pertussis (Tdap) or tetanus and diphtheria (Td) immunization. It is very important that you and your family (including grandparents) or others caring for your newborn are up-to-date with the Tdap or Td immunizations. The Tdap or Td immunization can help protect your newborn from getting ill. °¨ Rubella immunization. °¨ Varicella (chickenpox)  immunization. °¨ Influenza immunization. You should receive this annual immunization if you did not receive the immunization during your pregnancy. °  °This information is not intended to replace advice given to you by your health care provider. Make sure you discuss any questions you have with your health care provider. °  °Document Released: 05/08/2007 Document Revised: 04/04/2012 Document Reviewed: 03/07/2012 °Elsevier Interactive Patient Education ©2016 Elsevier Inc. ° °

## 2016-03-24 ENCOUNTER — Encounter: Payer: Self-pay | Admitting: Family

## 2016-03-24 ENCOUNTER — Ambulatory Visit (INDEPENDENT_AMBULATORY_CARE_PROVIDER_SITE_OTHER): Payer: BLUE CROSS/BLUE SHIELD | Admitting: Family

## 2016-03-24 DIAGNOSIS — N898 Other specified noninflammatory disorders of vagina: Secondary | ICD-10-CM

## 2016-03-24 DIAGNOSIS — Z3202 Encounter for pregnancy test, result negative: Secondary | ICD-10-CM | POA: Diagnosis not present

## 2016-03-24 DIAGNOSIS — Z30017 Encounter for initial prescription of implantable subdermal contraceptive: Secondary | ICD-10-CM | POA: Diagnosis not present

## 2016-03-24 LAB — POCT PREGNANCY, URINE: PREG TEST UR: NEGATIVE

## 2016-03-24 NOTE — Addendum Note (Signed)
Addended by: Garret ReddishBARNES, Sparrow [place] M on: 03/24/2016 03:33 PM   Modules accepted: Orders

## 2016-03-24 NOTE — Addendum Note (Signed)
Addended by: Cheree DittoGRAHAM, Betheny Suchecki A on: 03/24/2016 03:16 PM   Modules accepted: Orders

## 2016-03-24 NOTE — Progress Notes (Signed)
Subjective:     Samantha Sellers is a 25 y.o. female who presents for a postpartum visit. She is 8 weeks postpartum following a spontaneous vaginal delivery. I have fully reviewed the prenatal and intrapartum course. The delivery was at 41.1 gestational weeks. Outcome: spontaneous vaginal delivery. Anesthesia: none. Postpartum course has been unremarkable. Baby's course has been unremarkable. Baby is feeding by bottle - Similac Advance. Bleeding no bleeding.  Reports discharge last week, +white with odor.  Bowel function is normal. Bladder function is normal. Patient is not sexually active. Contraception method is none. Postpartum depression screening: negative.  The following portions of the patient's history were reviewed and updated as appropriate: allergies, current medications, past family history, past medical history, past social history, past surgical history and problem list.  Review of Systems Pertinent items are noted in HPI.   Objective:    BP 108/62   Pulse 66   Ht 5' (1.524 m)   Wt 129 lb (58.5 kg)   Breastfeeding? No   BMI 25.19 kg/m         General:  alert, cooperative and appears stated age   Breasts:  inspection negative, no nipple discharge or bleeding, no masses or nodularity palpable  Lungs: clear to auscultation bilaterally  Heart:  regular rate and rhythm, S1, S2 normal, no murmur, click, rub or gallop  Abdomen: soft, non-tender; bowel sounds normal; no masses,  no organomegaly   Vulva:  normal  Vagina: normal vagina, no discharge, exudate, lesion, or erythema; healed well  Cervix:  no cervical motion tenderness  Corpus: normal size, contour, position, consistency, mobility, non-tender  Adnexa:  normal adnexa  Rectal Exam: Not performed.   NEXPLANON INSERTION: Appropriate time out taken. Nexlanon site (left arm) identified and thea area was prepped in usual sterile fashon. 2 cc of 1% lidocaine was used to anesthetize the area starting with the distal end.   Next,  the area was cleansed with betadine and the Nexplanon was inserted without difficulty.  Pressure bandage was applied.  Pt was instructed to remove pressure bandage in a few hours, and keep insertion site covered with a bandaid for 3 days.  Assessment:     Normal postpartum exam. Pap smear not done at today's visit.   Plan:    1. Contraception: Nexplanon.  Explained the commons side effects of Nexplanon, including unpredicted spotting. 2. Follow up as needed.    Eino FarberWalidah Kennith GainN Karim, CNM

## 2016-03-25 LAB — WET PREP, GENITAL
Trich, Wet Prep: NONE SEEN
YEAST WET PREP: NONE SEEN

## 2016-03-26 ENCOUNTER — Telehealth: Payer: Self-pay | Admitting: Family

## 2016-03-26 DIAGNOSIS — N760 Acute vaginitis: Principal | ICD-10-CM

## 2016-03-26 DIAGNOSIS — B9689 Other specified bacterial agents as the cause of diseases classified elsewhere: Secondary | ICD-10-CM

## 2016-03-26 MED ORDER — METRONIDAZOLE 500 MG PO TABS: 500.0000 mg | ORAL_TABLET | Freq: Two times a day (BID) | ORAL | 0 refills | Status: AC

## 2016-03-26 NOTE — Telephone Encounter (Signed)
Called patient to inform regarding +clue and RX sent to pharmacy; left message to return call.  Please inform regarding BV and RX when patient calls.

## 2017-05-17 IMAGING — US US MFM FETAL BPP W/O NON-STRESS
1 series · 12 of 12 positions shown · non-contrast
Comparison: none

[Series 1: us mfm fetal bpp w/o non-stress · 12 acquisitions, 12 frames shown]
[im 1/12]
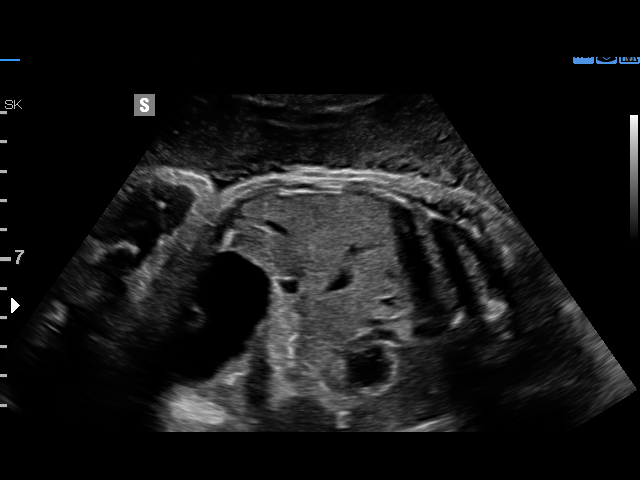
[im 2/12]
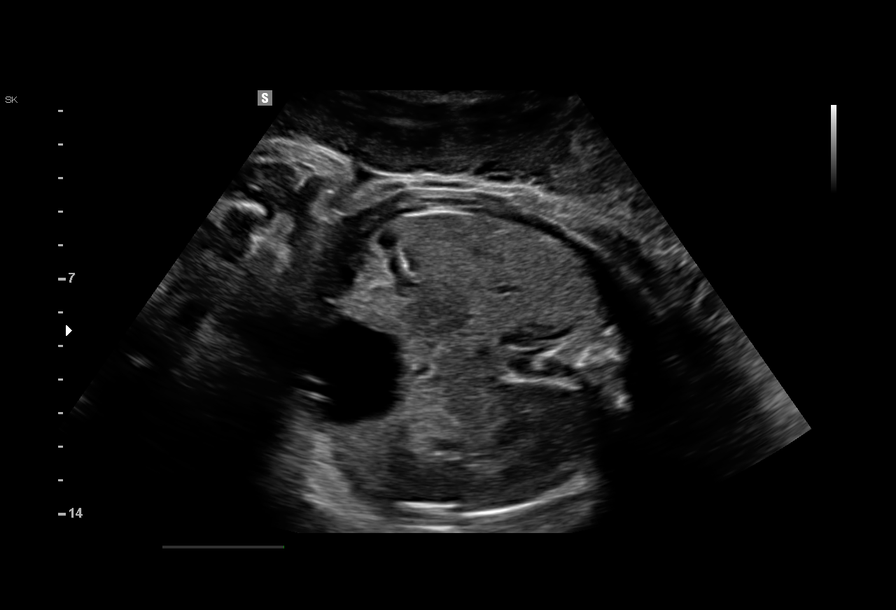
[im 3/12]
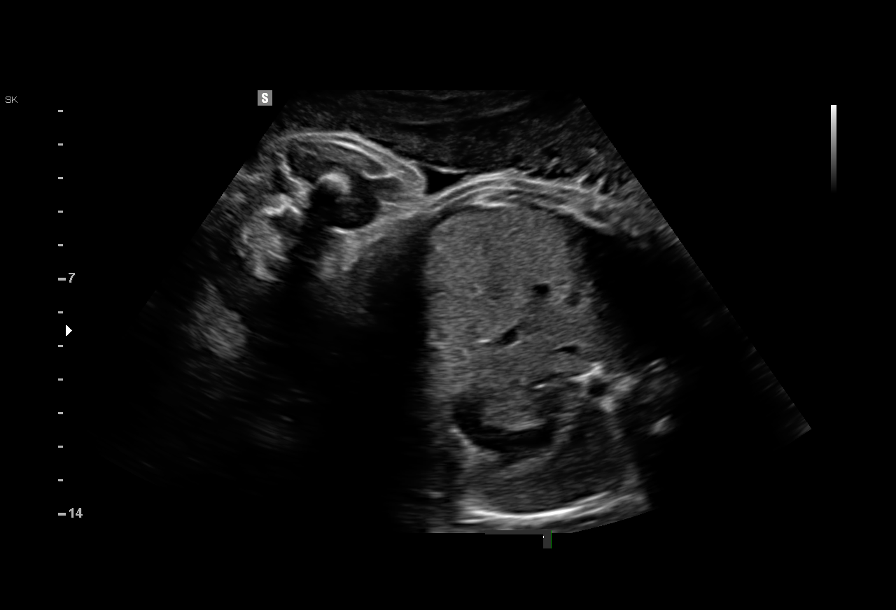
[im 4/12]
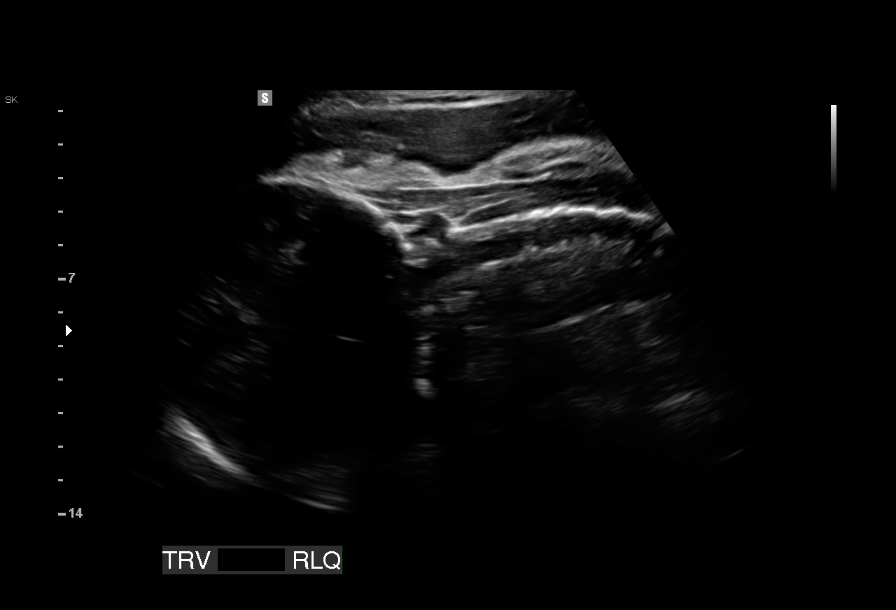
[im 5/12]
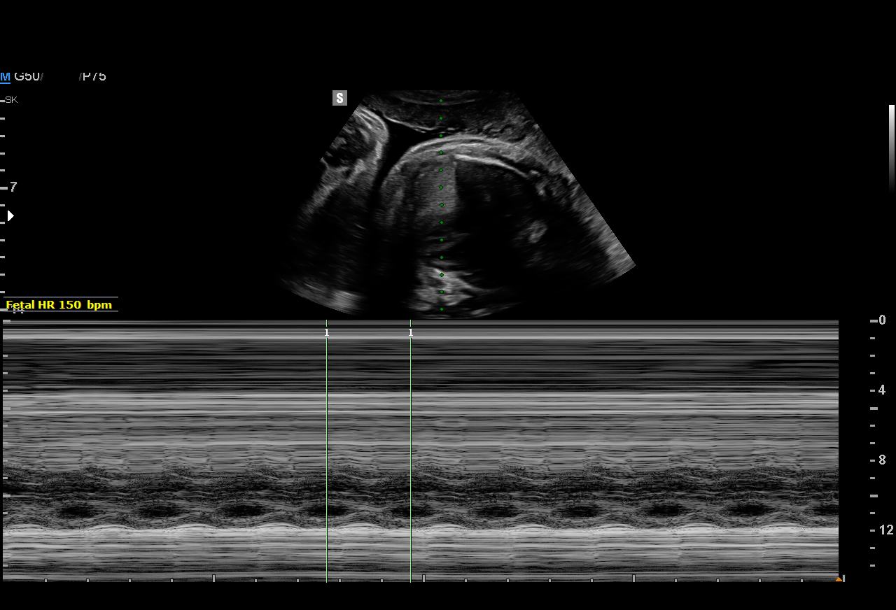
[im 6/12]
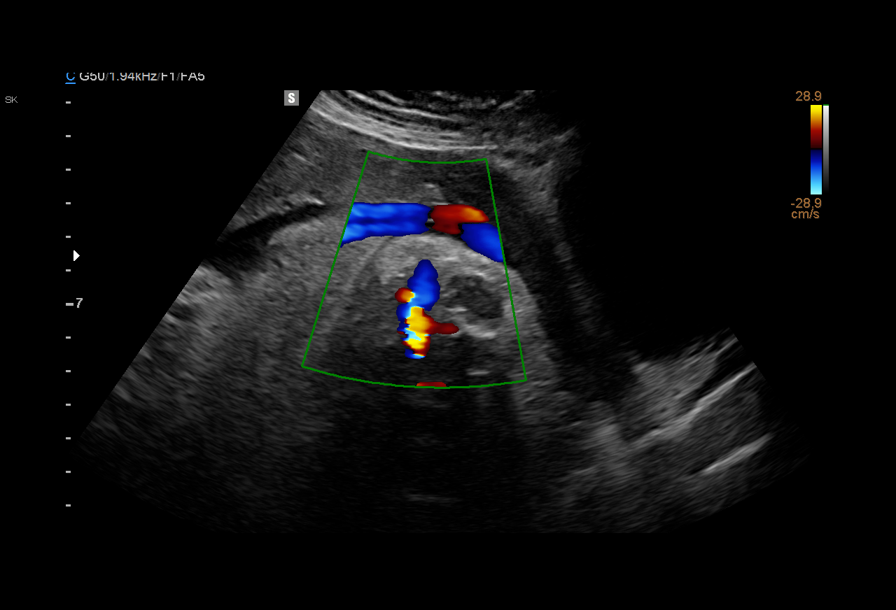
[im 7/12]
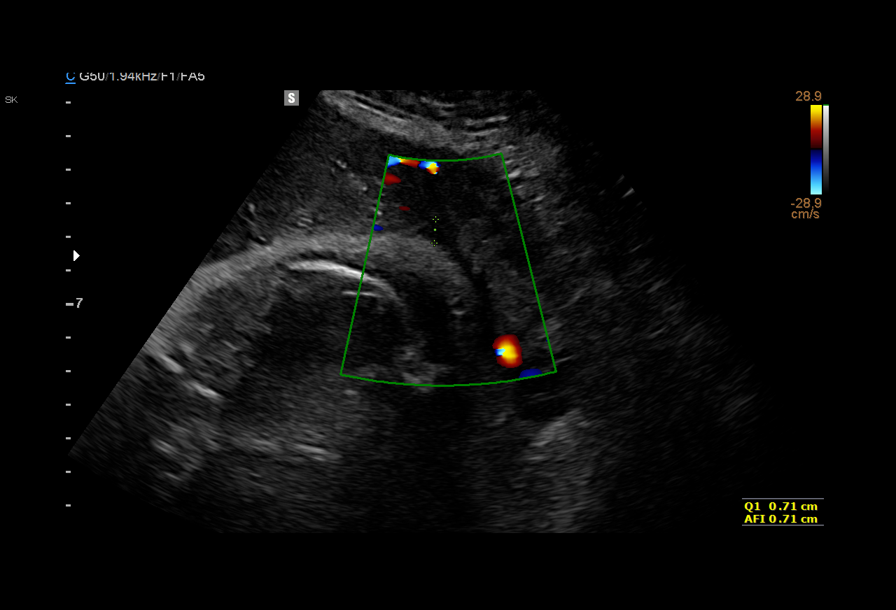
[im 8/12]
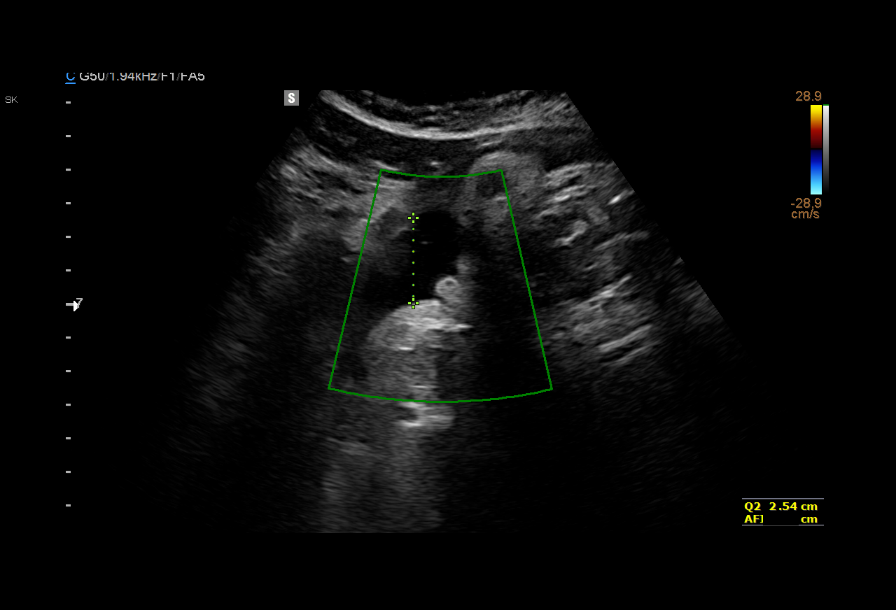
[im 9/12]
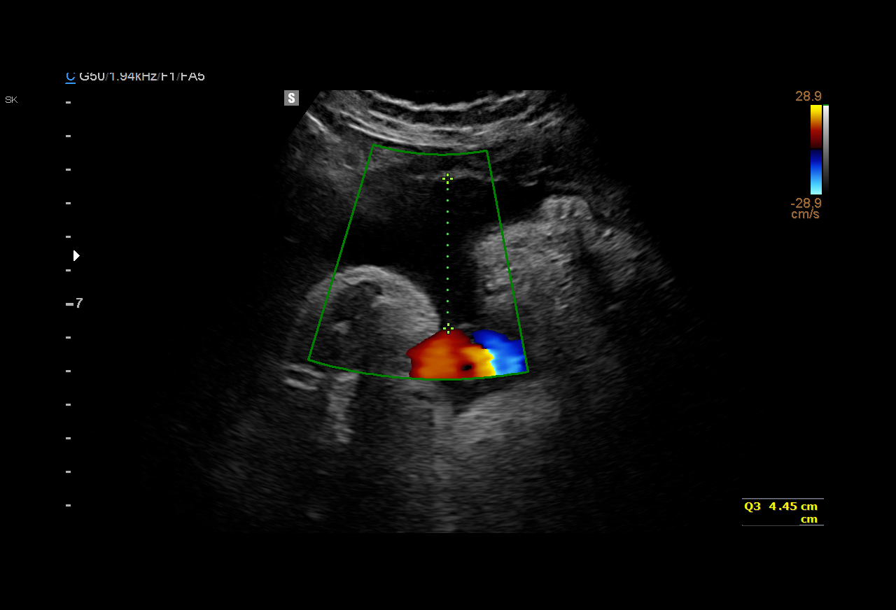
[im 10/12]
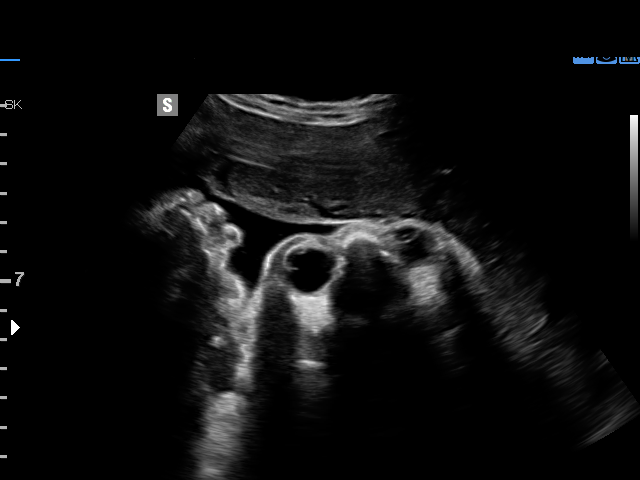
[im 11/12]
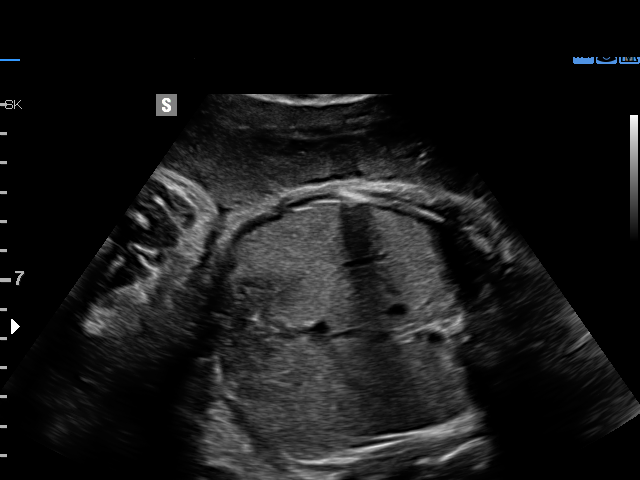
[im 12/12]
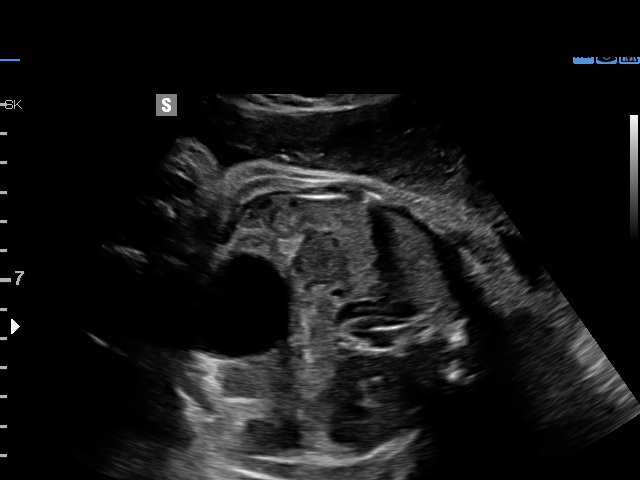

[12 of 12 positions shown; findings below may reference images not displayed]

1  FIOFOR BABCINSCHI          [id number]      [id number]     [id number]
Indications

41 weeks gestation of pregnancy
Abdominal pain in pregnancy
Postdate pregnancy (40-42 weeks)
OB History

Gravidity:    2         Term:   1
Living:       1
Fetal Evaluation

Num Of Fetuses:     1
Fetal Heart         150
Rate(bpm):
Cardiac Activity:   Observed
Presentation:       Oblique  HEAD RLQ

Amniotic Fluid
AFI FV:      Subjectively within normal limits

AFI Sum(cm)     %Tile
7.7             12
Biophysical Evaluation

Amniotic F.V:   Within normal limits       F. Tone:        Observed
F. Movement:    Observed                   Score:          [DATE]
F. Breathing:   Observed
Gestational Age

LMP:           41w 1d        Date:  04/15/15                 EDD:   01/20/16
Best:          41w 1d     Det. By:  LMP  (04/15/15)          EDD:   01/20/16
Anatomy

Stomach:               Appears normal, left   Bladder:                Appears normal
sided
Impression

Single IUP at 41w 1d
BPP [DATE]
Oblique lie with fetal head in the RLQ
Normal amniotic fluid volume
Recommendations

Follow-up ultrasounds as clinically indicated.

## 2019-07-24 ENCOUNTER — Other Ambulatory Visit: Payer: Self-pay

## 2019-07-24 DIAGNOSIS — Z20822 Contact with and (suspected) exposure to covid-19: Secondary | ICD-10-CM

## 2019-07-26 LAB — NOVEL CORONAVIRUS, NAA: SARS-CoV-2, NAA: DETECTED — AB

## 2021-04-02 ENCOUNTER — Other Ambulatory Visit (HOSPITAL_COMMUNITY)
Admission: RE | Admit: 2021-04-02 | Discharge: 2021-04-02 | Disposition: A | Discharge status: Home / Self Care | Payer: BC Managed Care – PPO | Source: Ambulatory Visit | Attending: Certified Nurse Midwife | Admitting: Certified Nurse Midwife

## 2021-04-02 ENCOUNTER — Ambulatory Visit (INDEPENDENT_AMBULATORY_CARE_PROVIDER_SITE_OTHER): Payer: BC Managed Care – PPO | Admitting: Family Medicine

## 2021-04-02 ENCOUNTER — Other Ambulatory Visit: Payer: Self-pay

## 2021-04-02 ENCOUNTER — Encounter: Payer: Self-pay | Admitting: Certified Nurse Midwife

## 2021-04-02 VITALS — BP 118/69 | HR 73 | Wt 153.0 lb

## 2021-04-02 DIAGNOSIS — Z01419 Encounter for gynecological examination (general) (routine) without abnormal findings: Secondary | ICD-10-CM

## 2021-04-02 DIAGNOSIS — Z124 Encounter for screening for malignant neoplasm of cervix: Secondary | ICD-10-CM | POA: Diagnosis not present

## 2021-04-02 DIAGNOSIS — Z3046 Encounter for surveillance of implantable subdermal contraceptive: Secondary | ICD-10-CM | POA: Diagnosis not present

## 2021-04-02 DIAGNOSIS — Z975 Presence of (intrauterine) contraceptive device: Secondary | ICD-10-CM | POA: Diagnosis not present

## 2021-04-02 LAB — POCT PREGNANCY, URINE: Preg Test, Ur: NEGATIVE

## 2021-04-02 MED ORDER — ETONOGESTREL 68 MG ~~LOC~~ IMPL
68.0000 mg | DRUG_IMPLANT | Freq: Once | SUBCUTANEOUS | Status: AC
Start: 2021-04-02 — End: 2021-04-02
  Administered 2021-04-02: 68 mg via SUBCUTANEOUS

## 2021-04-02 NOTE — Progress Notes (Signed)
     GYNECOLOGY OFFICE PROCEDURE NOTE  Samantha Sellers is a 30 y.o. [id number] here for Nexplanon removal and insertion.  Last pap smear was on 2018 and was normal. No other gynecologic concerns.  Nexplanon removal and insertion Procedure Patient identified, informed consent performed, consent signed.   Patient does understand that irregular bleeding is a very common side effect of this medication. She was advised to have backup contraception for one week after replacement of the implant. Pregnancy test in clinic today was negative.  Appropriate time out taken. Nexplanon site identified. Area prepped in usual sterile fashon. One ml of 1% lidocaine was used to anesthetize the area at the distal end of the implant. A small stab incision was made right beside the implant on the distal portion. The Nexplanon rod was grasped using hemostats and removed without difficulty. There was minimal blood loss. There were no complications. Area was then injected with 3 ml of 1 % lidocaine. She was re-prepped with betadine, Nexplanon removed from packaging, Device confirmed in needle, then inserted full length of needle and withdrawn per handbook instructions. Nexplanon was able to palpated in the patient's arm; patient palpated the insert herself.  There was minimal blood loss. Patient insertion site covered with guaze and a pressure bandage to reduce any bruising. The patient tolerated the procedure well and was given post procedure instructions.  She was advised to have backup contraception for one week.    A repeat pap smear was also collected.   Recommended she return in two weeks for repeat pregnancy test given unprotected sex 5 days prior and Patient's last menstrual period was 02/12/2021.  Venora Maples, MD/[facility], [facility] for [employer], [facility]

## 2021-04-08 LAB — CYTOLOGY - PAP
Comment: NEGATIVE
Diagnosis: NEGATIVE
High risk HPV: NEGATIVE

## 2021-04-20 ENCOUNTER — Ambulatory Visit: Payer: BC Managed Care – PPO

## 2021-04-24 ENCOUNTER — Encounter: Payer: Self-pay | Admitting: Radiology
